# Patient Record
Sex: Female | Born: 2000 | Race: White | Hispanic: No | Marital: Single | Smoking: Never smoker
Health system: Southern US, Community
[De-identification: ages and names within clinical notes are randomized; demographics above are authoritative.]

## PROBLEM LIST (undated history)

## (undated) ENCOUNTER — Ambulatory Visit: Admission: EM | Source: Home / Self Care

---
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
## 2022-11-28 ENCOUNTER — Ambulatory Visit
Admission: EM | Admit: 2022-11-28 | Discharge: 2022-11-28 | Disposition: A | Discharge status: Home / Self Care | Payer: Medicaid Other | Attending: Urgent Care | Admitting: Urgent Care

## 2022-11-28 DIAGNOSIS — N3001 Acute cystitis with hematuria: Secondary | ICD-10-CM | POA: Diagnosis not present

## 2022-11-28 DIAGNOSIS — Z7251 High risk heterosexual behavior: Secondary | ICD-10-CM

## 2022-11-28 DIAGNOSIS — R102 Pelvic and perineal pain: Secondary | ICD-10-CM | POA: Diagnosis not present

## 2022-11-28 LAB — POCT URINALYSIS DIP (MANUAL ENTRY)
Bilirubin, UA: NEGATIVE
Glucose, UA: NEGATIVE mg/dL
Ketones, POC UA: NEGATIVE mg/dL
Leukocytes, UA: NEGATIVE
Nitrite, UA: POSITIVE — AB
Protein Ur, POC: 30 mg/dL — AB
Spec Grav, UA: >=1.03 — AB (ref 1.010–1.025)
Urobilinogen, UA: 0.2 E.U./dL
pH, UA: 6.5 (ref 5.0–8.0)

## 2022-11-28 LAB — POCT URINE PREGNANCY: Preg Test, Ur: NEGATIVE

## 2022-11-28 MED ORDER — NITROFURANTOIN MONOHYD MACRO 100 MG PO CAPS: 100.0000 mg | ORAL_CAPSULE | Freq: Two times a day (BID) | ORAL | 0 refills | Status: DC

## 2022-11-28 NOTE — Discharge Instructions (Signed)
Please start nitrofurantoin to address an urinary tract infection. Make sure you hydrate very well with plain water and a quantity of 64 ounces of water a day.  Please limit drinks that are considered urinary irritants such as soda, sweet tea, coffee, energy drinks, alcohol.  These can worsen your urinary and genital symptoms but also be the source of them.  I will let you know about your urine culture and vaginal swab results through MyChart to see if we need to prescribe or change your antibiotics based off of those results.

## 2022-11-28 NOTE — ED Provider Notes (Addendum)
[facility] - URGENT [facility]ARE C[facility]  Note:  This document was prepared using Systems analyst and may include unintentional dictation errors.  MRN: [id number] DOB: January 26, 2001  Subjective:   Wendy Little is a 21 y.o. female presenting for 3-day history of acute onset dysuria, hematuria, malodorous urine.  She has also had some abnormal spotting.  No vaginal discharge.  She has had unprotected sex with 2 different female partners.  No fever, nausea, vomiting, flank pain, pelvic pain.  No current facility-administered medications for this encounter. No current outpatient medications on file.   No Known Allergies  History reviewed. No pertinent past medical history.   History reviewed. No pertinent surgical history.  History reviewed. No pertinent family history.  Social History   Tobacco Use   Smoking status: Never   Smokeless tobacco: Never  Substance Use Topics   Alcohol use: No    ROS   Objective:   Vitals: BP 110/74 (BP Location: Left Arm)   Pulse 89   Temp 99.5 F (37.5 C) (Oral)   Resp 18   Ht '5\' 5"'$  (1.651 m)   Wt 208 lb (94.3 kg)   LMP 11/14/2022   SpO2 96%   BMI 34.61 kg/m   Physical Exam Constitutional:      General: She is not in acute distress.    Appearance: Normal appearance. She is well-developed. She is not ill-appearing, toxic-appearing or diaphoretic.  HENT:     Head: Normocephalic and atraumatic.     Nose: Nose normal.     Mouth/Throat:     Mouth: Mucous membranes are moist.  Eyes:     General: No scleral icterus.       Right eye: No discharge.        Left eye: No discharge.     Extraocular Movements: Extraocular movements intact.     Conjunctiva/sclera: Conjunctivae normal.  Cardiovascular:     Rate and Rhythm: Normal rate.  Pulmonary:     Effort: Pulmonary effort is normal.  Abdominal:     General: Bowel sounds are normal. There is no distension.     Palpations: Abdomen is soft. There is no mass.      Tenderness: There is abdominal tenderness (right suprapubic region). There is no right CVA tenderness, left CVA tenderness, guarding or rebound.  Skin:    General: Skin is warm and dry.  Neurological:     General: No focal deficit present.     Mental Status: She is alert and oriented to person, place, and time.  Psychiatric:        Mood and Affect: Mood normal.        Behavior: Behavior normal.        Thought Content: Thought content normal.        Judgment: Judgment normal.     Results for orders placed or performed during the hospital encounter of 11/28/22 (from the past 24 hour(s))  POCT urine pregnancy     Status: None   Collection Time: 11/28/22  2:28 PM  Result Value Ref Range   Preg Test, Ur Negative Negative  POCT urinalysis dipstick     Status: Abnormal   Collection Time: 11/28/22  2:28 PM  Result Value Ref Range   Color, UA yellow yellow   Clarity, UA cloudy (A) clear   Glucose, UA negative negative mg/dL   Bilirubin, UA negative negative   Ketones, POC UA negative negative mg/dL   Spec Grav, UA >=1.030 (A) 1.010 - 1.025   Blood,  UA trace-intact (A) negative   pH, UA 6.5 5.0 - 8.0   Protein Ur, POC =30 (A) negative mg/dL   Urobilinogen, UA 0.2 0.2 or 1.0 E.U./dL   Nitrite, UA Positive (A) Negative   Leukocytes, UA Negative Negative    Assessment and Plan :   PDMP not reviewed this encounter.  1. Acute cystitis with hematuria   2. Unprotected sex   3. Acute pelvic pain, female     Start Macrobid to cover for acute cystitis, urine culture pending.  If patient test positive for bacterial vaginosis I do recommend treatment as she does have pelvic pain on exam and despite no vaginal discharge.  Recommended aggressive hydration, limiting urinary irritants. Counseled patient on potential for adverse effects with medications prescribed/recommended today, ER and return-to-clinic precautions discussed, patient verbalized understanding.      Jaynee Eagles, PA-C 11/28/22  1526

## 2022-11-28 NOTE — ED Triage Notes (Signed)
Pt states that she has had some abnormal bleeding or spotting.  Pt states that she has a foul smell or urine.

## 2022-11-29 LAB — CERVICOVAGINAL ANCILLARY ONLY
Bacterial Vaginitis (gardnerella): POSITIVE — AB
Chlamydia: NEGATIVE
Comment: NEGATIVE
Comment: NEGATIVE
Comment: NEGATIVE
Comment: NORMAL
Neisseria Gonorrhea: NEGATIVE
Trichomonas: NEGATIVE

## 2022-11-30 ENCOUNTER — Telehealth (HOSPITAL_COMMUNITY): Payer: Self-pay | Admitting: Emergency Medicine

## 2022-11-30 LAB — URINE CULTURE: Culture: >=100000 — AB

## 2022-11-30 MED ORDER — METRONIDAZOLE 500 MG PO TABS: 500.0000 mg | ORAL_TABLET | Freq: Two times a day (BID) | ORAL | 0 refills | Status: DC

## 2022-12-03 ENCOUNTER — Encounter (HOSPITAL_COMMUNITY): Payer: Self-pay

## 2022-12-03 ENCOUNTER — Other Ambulatory Visit: Payer: Self-pay

## 2022-12-03 ENCOUNTER — Emergency Department (HOSPITAL_COMMUNITY)
Admission: EM | Admit: 2022-12-03 | Discharge: 2022-12-03 | Disposition: A | Discharge status: Home / Self Care | Payer: Medicaid Other | Attending: Emergency Medicine | Admitting: Emergency Medicine

## 2022-12-03 ENCOUNTER — Emergency Department (HOSPITAL_COMMUNITY): Payer: Medicaid Other

## 2022-12-03 DIAGNOSIS — Z20822 Contact with and (suspected) exposure to covid-19: Secondary | ICD-10-CM | POA: Insufficient documentation

## 2022-12-03 DIAGNOSIS — J029 Acute pharyngitis, unspecified: Secondary | ICD-10-CM

## 2022-12-03 DIAGNOSIS — M25511 Pain in right shoulder: Secondary | ICD-10-CM | POA: Diagnosis not present

## 2022-12-03 DIAGNOSIS — K0501 Acute gingivitis, non-plaque induced: Secondary | ICD-10-CM | POA: Diagnosis not present

## 2022-12-03 DIAGNOSIS — B9789 Other viral agents as the cause of diseases classified elsewhere: Secondary | ICD-10-CM | POA: Diagnosis not present

## 2022-12-03 DIAGNOSIS — J028 Acute pharyngitis due to other specified organisms: Secondary | ICD-10-CM | POA: Insufficient documentation

## 2022-12-03 DIAGNOSIS — K0510 Chronic gingivitis, plaque induced: Secondary | ICD-10-CM

## 2022-12-03 LAB — RESP PANEL BY RT-PCR (RSV, FLU A&B, COVID)  RVPGX2
Influenza A by PCR: NEGATIVE
Influenza B by PCR: NEGATIVE
Resp Syncytial Virus by PCR: NEGATIVE
SARS Coronavirus 2 by RT PCR: NEGATIVE

## 2022-12-03 LAB — GROUP A STREP BY PCR: Group A Strep by PCR: NOT DETECTED

## 2022-12-03 MED ORDER — LIDOCAINE 5 % EX PTCH: 1.0000 | MEDICATED_PATCH | CUTANEOUS | 0 refills | Status: DC

## 2022-12-03 MED ORDER — LIDOCAINE 5 % EX PTCH
1.0000 | MEDICATED_PATCH | Freq: Once | CUTANEOUS | Status: DC
  Administered 2022-12-03: 1 via TRANSDERMAL
  Filled 2022-12-03: qty 1

## 2022-12-03 MED ORDER — ACETAMINOPHEN 325 MG PO TABS
650.0000 mg | ORAL_TABLET | Freq: Once | ORAL | Status: AC
  Administered 2022-12-03: 650 mg via ORAL
  Filled 2022-12-03: qty 2

## 2022-12-03 NOTE — ED Triage Notes (Signed)
Pt c/o sore throat, swollen gums, lips, excessive sweating, joint pain, fevers x 3 days; NAD in triage; denies sick contacts

## 2022-12-03 NOTE — ED Provider Triage Note (Signed)
Emergency Medicine Provider Triage Evaluation Note  Wendy Little , a 21 y.o. female  was evaluated in triage.  Pt complains of sore throat and fever x 3 days.  Tmax 101 F yesterday.  No difficulty swallowing or shortness of breath.  Review of Systems  Positive: Sore throat Negative: SOB  Physical Exam  BP 125/79 (BP Location: Right Arm)   Pulse 96   Temp 98.8 F (37.1 C)   Resp 15   Ht '5\' 5"'$  (1.651 m)   Wt 94 kg   LMP 11/14/2022   SpO2 100%   BMI 34.49 kg/m  Gen:   Awake, no distress   Resp:  Normal effort  MSK:   Moves extremities without difficulty  Other:    Medical Decision Making  Medically screening exam initiated at 12:25 PM.  Appropriate orders placed.  Michele Judy was informed that the remainder of the evaluation will be completed by another provider, this initial triage assessment does not replace that evaluation, and the importance of remaining in the ED until their evaluation is complete.  Strep COVID/flu   Suzy Bouchard, PA-C 12/03/22 1225

## 2022-12-03 NOTE — Discharge Instructions (Addendum)
It was a pleasure taking care of you today!  Your swabs were negative for COVID, Flu, RSV, and Strep. Attached is a Warden/ranger for local dentists in the area.  Also attached is information for the on-call dentist, you may call and set up a follow-up appointment regarding today's ED visit.  Ensure that you maintain fluid intake.  You will be sent a prescription for lidoderm patches, use as directed. You may take over the counter 500 mg tylenol every 6 hours and alternate with 600 mg ibuprofen every 6 hours as needed for your symptoms for no more than 7 days. You may follow-up with your primary care provider as needed.  Return to the ED if you experience increasing/worsening symptoms.

## 2022-12-03 NOTE — ED Provider Notes (Signed)
[facility] EMERGENCY DEPARTMENT Provider Note   CSN: [id number] Arrival date & time: 12/03/22  1204     History  Chief Complaint  Patient presents with   Sore Throat   Oral Swelling    Wendy Little is a 21 y.o. female who presents emergency department with concerns for sore throat onset 3 days.  Has associated fever (max temp 101 F yesterday).  Also notes right shoulder pain, patient works at [employer] and notes that she may have lifted an item improperly.  Also voices concern for swelling of the gums and bleeding of her gums when she brushes her teeth.  Patient does have a dentist at this time however has not followed up with them.  No meds tried at home.  No sick contacts.  Denies trouble breathing, trouble swallowing, gum drainage, rhinorrhea, nasal congestion, cough.  The history is provided by the patient and a parent. No language interpreter was used.       Home Medications Prior to Admission medications   Medication Sig Start Date End Date Taking? Authorizing Provider  lidocaine (LIDODERM) 5 % Place 1 patch onto the skin daily. Remove & Discard patch within 12 hours or as directed by MD 12/03/22  Yes Ostin Mathey A, PA-C  metroNIDAZOLE (FLAGYL) 500 MG tablet Take 1 tablet (500 mg total) by mouth 2 (two) times daily. 11/30/22   Lamptey, Myrene Galas, MD  nitrofurantoin, macrocrystal-monohydrate, (MACROBID) 100 MG capsule Take 1 capsule (100 mg total) by mouth 2 (two) times daily. 11/28/22   Jaynee Eagles, PA-C      Allergies    Patient has no known allergies.    Review of Systems   Review of Systems  All other systems reviewed and are negative.   Physical Exam Updated Vital Signs BP 125/79 (BP Location: Right Arm)   Pulse 96   Temp 98.8 F (37.1 C)   Resp 15   Ht '5\' 5"'$  (1.651 m)   Wt 94 kg   LMP 11/14/2022   SpO2 100%   BMI 34.49 kg/m  Physical Exam Vitals and nursing note reviewed.  Constitutional:      General: She is not in acute distress.     Appearance: Normal appearance.  HENT:     Mouth/Throat:     Pharynx: Oropharynx is clear. Uvula midline. No posterior oropharyngeal erythema or uvula swelling.     Tonsils: No tonsillar exudate or tonsillar abscesses.     Comments: Uvula midline without swelling. No posterior pharyngeal erythema or tonsillar exudate noted. Patent airway. Pt able to speak in clear complete sentences. Tolerating oral secretions.  Swelling of the gums noted. Eyes:     General: No scleral icterus.    Extraocular Movements: Extraocular movements intact.  Cardiovascular:     Rate and Rhythm: Normal rate.  Pulmonary:     Effort: Pulmonary effort is normal. No respiratory distress.  Abdominal:     Palpations: Abdomen is soft. There is no mass.     Tenderness: There is no abdominal tenderness.  Musculoskeletal:        General: Normal range of motion.     Cervical back: Neck supple.     Comments: Mild tenderness to palpation noted to posterior aspect of right shoulder.  Full active range of motion of right shoulder.  Skin:    General: Skin is warm and dry.     Findings: No rash.  Neurological:     Mental Status: She is alert.     Sensory:  Sensation is intact.     Motor: Motor function is intact.  Psychiatric:        Behavior: Behavior normal.     ED Results / Procedures / Treatments   Labs (all labs ordered are listed, but only abnormal results are displayed) Labs Reviewed  GROUP A STREP BY PCR  RESP PANEL BY RT-PCR (RSV, FLU A&B, COVID)  [id number]    EKG None  Radiology DG Shoulder Right  Result Date: 12/03/2022 CLINICAL DATA:  Nontraumatic shoulder pain EXAM: RIGHT SHOULDER - 2+ VIEW COMPARISON:  04/24/2017 FINDINGS: There is no evidence of fracture or dislocation. There is no evidence of arthropathy or other focal bone abnormality. Soft tissues are unremarkable. IMPRESSION: Normal radiographs. Electronically Signed   By: Nelson Chimes M.D.   On: 12/03/2022 14:22    Procedures Procedures     Medications Ordered in ED Medications  lidocaine (LIDODERM) 5 % 1 patch (1 patch Transdermal Patch Applied 12/03/22 1337)  acetaminophen (TYLENOL) tablet 650 mg (650 mg Oral Given 12/03/22 1337)    ED Course/ Medical Decision Making/ A&P Clinical Course as of 12/03/22 1533  Sat Dec 03, 2022  1436 Re-evaluated and pt noted mild improvement of symptoms with treatment regimen. Discussed discharge treatment plan. Pt agreeable at this time. Pt appears safe for discharge. [SB]    Clinical Course User Index [SB] Sarann Tregre A, PA-C                           Medical Decision Making Amount and/or Complexity of Data Reviewed Radiology: ordered.  Risk OTC drugs. Prescription drug management.   Pt presents with concerns for sore throat onset 3 days.  Denies sick contacts.  Patient afebrile.  On exam patient with uvula midline without swelling, no posterior pharyngeal erythema or tonsillar exudate.  Patient tolerating oral secretions and able to speak in clear complete sentences.  Patient with mild tenderness to palpation noted to posterior aspect of right shoulder with full active range of motion of right shoulder.  Differential diagnosis includes COVID, flu, RSV, strep, viral pharyngitis, fracture, dislocation.    Additional history obtained:  Additional history obtained from Parent  Labs:  I ordered, and personally interpreted labs.  The pertinent results include:   Negative COVID, Flu, RSV, strep swabs  Imaging: I ordered imaging studies including right shoulder xray I independently visualized and interpreted imaging which showed: no acute findings I agree with the radiologist interpretation  Medications:  I ordered medication including Tylenol, lidoderm, ice for symptom management Reevaluation of the patient after these medicines and interventions, I reevaluated the patient and found that they have improved I have reviewed the patients home medicines and have made adjustments  as needed  Disposition: Presentation suspicious for viral pharyngitis, gingivitis, right shoulder pain.  Doubt COVID, flu, RSV, strep.  Doubt fracture or dislocation at this time. After consideration of the diagnostic results and the patients response to treatment, I feel that the patient would benefit from Discharge home. Offered work note, pt declines at this time.  Prescription for Lidoderm patch sent to patient's pharmacy.  Pt provided with dental resource guide as well as information for on-call dentist. Supportive care measures and strict return precautions discussed with patient at bedside. Pt acknowledges and verbalizes understanding. Pt appears safe for discharge. Follow up as indicated in discharge paperwork.    This chart was dictated using voice recognition software, Dragon. Despite the best efforts of this provider to proofread and  correct errors, errors may still occur which can change documentation meaning.  Final Clinical Impression(s) / ED Diagnoses Final diagnoses:  Viral pharyngitis  Gingivitis  Acute pain of right shoulder    Rx / DC Orders ED Discharge Orders          Ordered    lidocaine (LIDODERM) 5 %  Every 24 hours        12/03/22 1435              Naika Noto A, PA-C 12/03/22 1533    Godfrey Pick, MD 12/03/22 2141

## 2023-03-27 ENCOUNTER — Ambulatory Visit
Admission: EM | Admit: 2023-03-27 | Discharge: 2023-03-27 | Disposition: A | Discharge status: Home / Self Care | Payer: Medicaid Other

## 2023-03-27 DIAGNOSIS — J069 Acute upper respiratory infection, unspecified: Secondary | ICD-10-CM

## 2023-03-27 LAB — POCT RAPID STREP A (OFFICE): Rapid Strep A Screen: NEGATIVE

## 2023-03-27 MED ORDER — LEVOCETIRIZINE DIHYDROCHLORIDE 5 MG PO TABS: 5.0000 mg | ORAL_TABLET | Freq: Every evening | ORAL | 0 refills | Status: DC

## 2023-03-27 MED ORDER — IPRATROPIUM BROMIDE 0.03 % NA SOLN: 2.0000 | Freq: Two times a day (BID) | NASAL | 0 refills | Status: DC

## 2023-03-27 NOTE — ED Triage Notes (Signed)
Pt c/o sneezing, cough, runny nose and sore throat for a few days.  Home interventions: nyquil

## 2023-03-27 NOTE — Discharge Instructions (Addendum)
Your symptoms sound most consistent with a viral illness. Your strep test is negative. Supportive care is the mainstay of treatment - rest, hydration, frequent hand washing, tylenol or ibuprofen as needed for fever or aches.  I have prescribed you a nasal spray and antihistamine to help with the symptoms. Salt water gargles, chloraseptic spray and lozenges may help with your throat.

## 2023-03-27 NOTE — ED Provider Notes (Signed)
[facility]    CSN: [id number] Arrival date & time: 03/27/23  1336      History   Chief Complaint Chief Complaint  Patient presents with   Sore Throat   Nasal Congestion    HPI Anh Winfrey is a 22 y.o. female.   22 year old female presents today due to concerns of a sore throat, nasal congestion, cough, swollen lymph nodes, and possible fever.  States she works at [employer] and one of her coworkers have been sick recently.  She is concerned about possible strep throat.  Symptoms started 2 days ago, sore throat started her symptoms, followed by nasal congestion yesterday.  Reports symptoms are worse in the morning with thick posterior drainage.  She tried NyQuil 1 time last evening, no other treatments tried.  Denies a known fever, but states she has been feeling warm.   Sore Throat    History reviewed. No pertinent past medical history.  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 No pertinent surgical history.  OB History   No obstetric history on file.      Home Medications    Prior to Admission medications   Medication Sig Start Date End Date Taking? Authorizing Provider  estradiol (ESTRACE) 2 MG tablet Take 1 tablet by mouth daily. 03/14/23 04/13/23 Yes [provider]  etonogestrel (NEXPLANON) 68 MG IMPL implant 68 mg by subdermal route once.    [provider]  ipratropium (ATROVENT) 0.03 % nasal spray Place 2 sprays into both nostrils every 12 (twelve) hours. 03/27/23  Yes Rhilee Currin L, PA  levocetirizine (XYZAL) 5 MG tablet Take 1 tablet (5 mg total) by mouth every evening. 03/27/23  Yes Tyeson Tanimoto, Sherren Kerns, PA    Family History History reviewed. No pertinent family history.  Social History Social History    Tobacco Use   Smoking status: Never   Smokeless tobacco: Never  Vaping Use   Vaping Use: Never used  Substance Use Topics   Alcohol use: No   Drug use: Never     Allergies   Patient has no known allergies.   Review of Systems Review of Systems As per HPI  Physical Exam Triage Vital Signs ED Triage Vitals  Enc Vitals Group     BP 03/27/23 1354 123/79     Pulse Rate 03/27/23 1354 (!) 105     Resp 03/27/23 1354 18     Temp 03/27/23 1354 98.4 F (36.9 C)     Temp Source 03/27/23 1354 Oral     SpO2 03/27/23 1354 98 %     Weight --      Height --      Head Circumference --      Peak Flow --      Pain Score 03/27/23 1353 3     Pain Loc --      Pain Edu? --      Excl. in [place]? --    No data found.  Updated Vital Signs BP 123/79 (BP Location: Right Arm)   Pulse (!) 105   Temp 98.4 F (36.9 C) (Oral)   Resp 18   SpO2 98%   Visual Acuity Right Eye Distance:   Left Eye Distance:   Bilateral Distance:    Right Eye Near:   Left Eye Near:  Bilateral Near:     Physical Exam Vitals and nursing note reviewed.  Constitutional:      Appearance: She is well-developed and normal weight. She is not ill-appearing, toxic-appearing or diaphoretic.  HENT:     Head: Normocephalic and atraumatic.     Right Ear: Tympanic membrane and ear canal normal. No drainage, swelling or tenderness. No middle ear effusion. Tympanic membrane is not erythematous.     Left Ear: Tympanic membrane and ear canal normal. No drainage, swelling or tenderness.  No middle ear effusion. Tympanic membrane is not erythematous.     Nose: Congestion and rhinorrhea present.     Mouth/Throat:     Mouth: Mucous membranes are moist. No oral lesions.     Pharynx: Oropharynx is clear. Posterior oropharyngeal erythema (minimal posterior pharynx) present. No pharyngeal swelling, oropharyngeal exudate or uvula swelling.     Tonsils: No tonsillar exudate or tonsillar abscesses.  Eyes:     Extraocular  Movements:     Right eye: Normal extraocular motion.     Left eye: Normal extraocular motion.     Conjunctiva/sclera: Conjunctivae normal.     Pupils: Pupils are equal, round, and reactive to light.  Neck:     Thyroid: No thyromegaly.  Cardiovascular:     Rate and Rhythm: Normal rate.     Heart sounds: Normal heart sounds. No murmur heard. Pulmonary:     Effort: Pulmonary effort is normal. No respiratory distress.     Breath sounds: Normal breath sounds. No stridor. No wheezing, rhonchi or rales.  Chest:     Chest wall: No tenderness.  Abdominal:     General: Bowel sounds are normal. There is no distension.     Palpations: Abdomen is soft. There is no mass.     Tenderness: There is no abdominal tenderness. There is no guarding or rebound.     Hernia: No hernia is present.  Musculoskeletal:     Cervical back: Normal range of motion and neck supple.  Lymphadenopathy:     Cervical: No cervical adenopathy.  Skin:    General: Skin is warm.     Capillary Refill: Capillary refill takes less than 2 seconds.     Coloration: Skin is not pale.     Findings: No erythema or rash.  Neurological:     General: No focal deficit present.     Mental Status: She is alert and oriented to person, place, and time.  Psychiatric:        Mood and Affect: Mood normal.        Behavior: Behavior normal.      UC Treatments / Results  Labs (all labs ordered are listed, but only abnormal results are displayed) Labs Reviewed  POCT RAPID STREP A (OFFICE)    EKG   Radiology No results found.  Procedures Procedures (including critical care time)  Medications Ordered in UC Medications - No data to display  Initial Impression / Assessment and Plan / UC Course  I have reviewed the triage vital signs and the nursing notes.  Pertinent labs & imaging results that were available during my care of the patient were reviewed by me and considered in my medical decision making (see chart for  details).     Viral URI - sx consistent with viral illness. VSS, pt has tried nyquil x 1 dose. Suspect rhinovirus vs allergies. Will start atrovent nasal with xyzal for symptomatic relief.    Final Clinical Impressions(s) / UC Diagnoses   Final diagnoses:  Viral upper respiratory tract infection with cough     Discharge Instructions      Your symptoms sound most consistent with a viral illness. Your strep test is negative. Supportive care is the mainstay of treatment - rest, hydration, frequent hand washing, tylenol or ibuprofen as needed for fever or aches.  I have prescribed you a nasal spray and antihistamine to help with the symptoms. Salt water gargles, chloraseptic spray and lozenges may help with your throat.      ED Prescriptions     Medication Sig Dispense Auth. Provider   ipratropium (ATROVENT) 0.03 % nasal spray Place 2 sprays into both nostrils every 12 (twelve) hours. 30 mL Sherril Shipman L, PA   levocetirizine (XYZAL) 5 MG tablet Take 1 tablet (5 mg total) by mouth every evening. 30 tablet Illyanna Petillo L, [place]      PDMP not reviewed this encounter.   Chaney Malling, [place] 03/27/23 1431

## 2023-04-20 ENCOUNTER — Ambulatory Visit
Admission: EM | Admit: 2023-04-20 | Discharge: 2023-04-20 | Disposition: A | Discharge status: Home / Self Care | Payer: Medicaid Other | Attending: Nurse Practitioner | Admitting: Nurse Practitioner

## 2023-04-20 DIAGNOSIS — L309 Dermatitis, unspecified: Secondary | ICD-10-CM | POA: Diagnosis not present

## 2023-04-20 MED ORDER — CLOTRIMAZOLE-BETAMETHASONE 1-0.05 % EX CREA
TOPICAL_CREAM | CUTANEOUS | 0 refills | Status: DC
Start: 2023-04-20 — End: 2024-03-28

## 2023-04-20 NOTE — ED Triage Notes (Signed)
Pt has multiple raised, red sores to toes across bilat feet. Pt reports have been there for approx 1 month, but the pain has increased. States may be chemical burn from products used at work.

## 2023-04-20 NOTE — ED Provider Notes (Signed)
[facility]    CSN: [id number] Arrival date & time: 04/20/23  1619      History   Chief Complaint No chief complaint on file.   HPI Wendy Little is a 22 y.o. female presents for evaluation of red bumps on her feet.  Patient reports 1 months she has scattered red bumps on her feet that are mildly pruritic.  Denies any swelling, drainage, warmth, fevers, chills.  Denies history of eczema or psoriasis.  She does states she works at a [employer] and when they clean the floors at night they use a mixture of hot water and a cleaning agent and is not sure if this is related to that.  She has not tried any OTC medications for symptoms.  No other concerns at this time.  HPI  History reviewed. No pertinent past medical history.  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 No pertinent surgical history.  OB History   No obstetric history on file.      Home Medications    Prior to Admission medications   Medication Sig Start Date End Date Taking? Authorizing Provider  clotrimazole-betamethasone (LOTRISONE) cream Apply to affected area 2 times daily prn 04/20/23  Yes Radford Pax, NP  estradiol (ESTRACE) 2 MG tablet Take 1 tablet by mouth daily. 03/14/23 04/13/23  [provider]  etonogestrel (NEXPLANON) 68 MG IMPL implant 68 mg by subdermal route once.    [provider]  ipratropium (ATROVENT) 0.03 % nasal spray Place 2 sprays into both nostrils every 12 (twelve) hours. 03/27/23   Crain, Whitney L, PA  levocetirizine (XYZAL) 5 MG tablet Take 1 tablet (5 mg total) by mouth every evening. 03/27/23   Maretta Bees, PA    Family History History reviewed. No pertinent family history.  Social History Social History   Tobacco Use    Smoking status: Never   Smokeless tobacco: Never  Vaping Use   Vaping Use: Every day  Substance Use Topics   Alcohol use: No   Drug use: Never     Allergies   Patient has no known allergies.   Review of Systems Review of Systems  Skin:  Positive for rash.     Physical Exam Triage Vital Signs ED Triage Vitals  Enc Vitals Group     BP 04/20/23 1709 120/76     Pulse Rate 04/20/23 1709 76     Resp 04/20/23 1709 18     Temp 04/20/23 1709 98.2 F (36.8 C)     Temp Source 04/20/23 1709 Oral     SpO2 04/20/23 1709 96 %     Weight --      Height --      Head Circumference --      Peak Flow --      Pain Score 04/20/23 1707 8     Pain Loc --      Pain Edu? --      Excl. in [place]? --    No data found.  Updated Vital Signs BP 120/76 (BP Location: Right Arm)   Pulse 76   Temp 98.2 F (36.8 C) (Oral)   Resp 18   SpO2 96%   Visual Acuity Right Eye Distance:   Left Eye Distance:   Bilateral Distance:  Right Eye Near:   Left Eye Near:    Bilateral Near:     Physical Exam Vitals and nursing note reviewed.  Constitutional:      General: She is not in acute distress.    Appearance: Normal appearance. She is not ill-appearing.  HENT:     Head: Normocephalic and atraumatic.  Eyes:     Pupils: Pupils are equal, round, and reactive to light.  Cardiovascular:     Rate and Rhythm: Normal rate.  Pulmonary:     Effort: Pulmonary effort is normal.  Musculoskeletal:       Feet:  Feet:     Comments: 3-4 mildly erythematic raised papules to bilateral great toes and 1 to right third toe.  No vesicles, drainage, lesions, scaling.  Nontender to palpation.  No swelling.  No areas on soles of feet or ankles. Skin:    General: Skin is warm and dry.  Neurological:     General: No focal deficit present.     Mental Status: She is alert and oriented to person, place, and time.  Psychiatric:        Mood and Affect: Mood normal.        Behavior: Behavior normal.      UC  Treatments / Results  Labs (all labs ordered are listed, but only abnormal results are displayed) Labs Reviewed - No data to display  EKG   Radiology No results found.  Procedures Procedures (including critical care time)  Medications Ordered in UC Medications - No data to display  Initial Impression / Assessment and Plan / UC Course  I have reviewed the triage vital signs and the nursing notes.  Pertinent labs & imaging results that were available during my care of the patient were reviewed by me and considered in my medical decision making (see chart for details).    I reviewed labs from March 2024 as well as recent provider notes. Reviewed symptoms with patient.  Looks more dermatitis in nature than chemical reaction. Trial of betamethasone clotrimazole to areas.  Advised to keep feet clean and dry Follow-up with PCP if symptoms do not improve ER precautions reviewed and patient verbalized understanding Final Clinical Impressions(s) / UC Diagnoses   Final diagnoses:  Dermatitis     Discharge Instructions      Start clotrimazole-betamethasone topical cream to the areas on your feet.  Please keep your feet clean and dry Please follow-up with your PCP if your symptoms or not improving Please go to the emergency room if you develop any worsening symptoms   ED Prescriptions     Medication Sig Dispense Auth. Provider   clotrimazole-betamethasone (LOTRISONE) cream Apply to affected area 2 times daily prn 45 g Radford Pax, NP      PDMP not reviewed this encounter.   Radford Pax, NP 04/20/23 [phone]

## 2023-04-20 NOTE — Discharge Instructions (Signed)
Start clotrimazole-betamethasone topical cream to the areas on your feet.  Please keep your feet clean and dry Please follow-up with your PCP if your symptoms or not improving Please go to the emergency room if you develop any worsening symptoms

## 2024-03-28 ENCOUNTER — Ambulatory Visit
Admission: EM | Admit: 2024-03-28 | Discharge: 2024-03-28 | Disposition: A | Discharge status: Home / Self Care | Attending: Family Medicine | Admitting: Family Medicine

## 2024-03-28 DIAGNOSIS — J988 Other specified respiratory disorders: Secondary | ICD-10-CM

## 2024-03-28 DIAGNOSIS — J309 Allergic rhinitis, unspecified: Secondary | ICD-10-CM

## 2024-03-28 DIAGNOSIS — B9789 Other viral agents as the cause of diseases classified elsewhere: Secondary | ICD-10-CM

## 2024-03-28 MED ORDER — CETIRIZINE HCL 10 MG PO TABS: 10.0000 mg | ORAL_TABLET | Freq: Every day | ORAL | 0 refills | Status: DC

## 2024-03-28 MED ORDER — PROMETHAZINE-DM 6.25-15 MG/5ML PO SYRP: 5.0000 mL | ORAL_SOLUTION | Freq: Three times a day (TID) | ORAL | 0 refills | Status: AC | PRN

## 2024-03-28 MED ORDER — PSEUDOEPHEDRINE HCL 60 MG PO TABS: 60.0000 mg | ORAL_TABLET | Freq: Three times a day (TID) | ORAL | 0 refills | Status: DC | PRN

## 2024-03-28 NOTE — ED Triage Notes (Signed)
 Patient presents to the office for sinus congestion,sore throat and cough x 1 day.  Home Intervention: Nyquil last night.

## 2024-03-28 NOTE — ED Provider Notes (Signed)
 [facility]  Note:  This document was prepared using Conservation officer, historic buildings and may include unintentional dictation errors.  MRN: [id number] DOB: Jun 04, 2001  Subjective:   Wendy Little is a 23 y.o. female presenting for 1 day history of sinus congestion, drainage, throat pain, coughing.  No fever, ear pain, chest pain, shortness of breath or wheezing.  No asthma.  No smoking of any kind including cigarettes, cigars, vaping, marijuana use.  Declines respiratory testing.  Has allergic rhinitis but does not take anything consistently for this.  No current facility-administered medications for this encounter.  Current Outpatient Medications:    etonogestrel (NEXPLANON) 68 MG IMPL implant, 68 mg by subdermal route once., Disp: , Rfl:    clotrimazole-betamethasone (LOTRISONE) cream, Apply to affected area 2 times daily prn, Disp: 45 g, Rfl: 0   estradiol (ESTRACE) 2 MG tablet, Take 1 tablet by mouth daily., Disp: , Rfl:    ipratropium (ATROVENT) 0.03 % nasal spray, Place 2 sprays into both nostrils every 12 (twelve) hours., Disp: 30 mL, Rfl: 0   levocetirizine (XYZAL) 5 MG tablet, Take 1 tablet (5 mg total) by mouth every evening., Disp: 30 tablet, Rfl: 0   No Known Allergies  History reviewed. No pertinent past medical history.   History reviewed. No pertinent surgical history.  History reviewed. No pertinent family history.  Social History   Tobacco Use   Smoking status: Never   Smokeless tobacco: Never  Vaping Use   Vaping status: Every Day  Substance Use Topics   Alcohol use: No   Drug use: Never    ROS   Objective:   Vitals: BP 107/76 (BP Location: Left Arm)   Pulse (!) 102   Temp 99.1 F (37.3 C) (Oral)   Resp 18   LMP 03/19/2024 (Approximate)   SpO2 96%   Physical Exam Constitutional:      General: She is not in acute distress.    Appearance: Normal appearance. She is well-developed and normal weight. She is not  ill-appearing, toxic-appearing or diaphoretic.  HENT:     Head: Normocephalic and atraumatic.     Right Ear: Tympanic membrane, ear canal and external ear normal. No drainage or tenderness. No middle ear effusion. There is no impacted cerumen. Tympanic membrane is not erythematous or bulging.     Left Ear: Tympanic membrane, ear canal and external ear normal. No drainage or tenderness.  No middle ear effusion. There is no impacted cerumen. Tympanic membrane is not erythematous or bulging.     Nose: Congestion and rhinorrhea present.     Mouth/Throat:     Mouth: Mucous membranes are moist. No oral lesions.     Pharynx: No pharyngeal swelling, oropharyngeal exudate, posterior oropharyngeal erythema or uvula swelling.     Tonsils: No tonsillar exudate or tonsillar abscesses.  Eyes:     General: No scleral icterus.       Right eye: No discharge.        Left eye: No discharge.     Extraocular Movements: Extraocular movements intact.     Right eye: Normal extraocular motion.     Left eye: Normal extraocular motion.     Conjunctiva/sclera: Conjunctivae normal.  Cardiovascular:     Rate and Rhythm: Normal rate and regular rhythm.     Heart sounds: Normal heart sounds. No murmur heard.    No friction rub. No gallop.  Pulmonary:     Effort: Pulmonary effort is normal. No respiratory distress.  Breath sounds: No stridor. No wheezing, rhonchi or rales.  Chest:     Chest wall: No tenderness.  Musculoskeletal:     Cervical back: Normal range of motion and neck supple.  Lymphadenopathy:     Cervical: No cervical adenopathy.  Skin:    General: Skin is warm and dry.  Neurological:     General: No focal deficit present.     Mental Status: She is alert and oriented to person, place, and time.  Psychiatric:        Mood and Affect: Mood normal.        Behavior: Behavior normal.     Assessment and Plan :   PDMP not reviewed this encounter.  1. Viral respiratory infection   2. Allergic  rhinitis, unspecified seasonality, unspecified trigger    Deferred imaging given clear cardiopulmonary exam, hemodynamically stable vital signs. Suspect viral URI, viral syndrome. Physical exam findings reassuring and vital signs stable for discharge. Advised supportive care, offered symptomatic relief. Counseled patient on potential for adverse effects with medications prescribed/recommended today, ER and return-to-clinic precautions discussed, patient verbalized understanding.     Wallis Bamberg, PA-C 03/28/24 1316

## 2024-03-28 NOTE — Discharge Instructions (Signed)
 We will manage this as a viral respiratory illness. For sore throat or cough try using a honey-based tea. Use 3 teaspoons of honey with juice squeezed from half lemon. Place shaved pieces of ginger into 1/2-1 cup of water and warm over stove top. Then mix the ingredients and repeat every 4 hours as needed. Please take ibuprofen 600mg  every 6 hours with food alternating with OR taken together with Tylenol 500mg -650mg  every 6 hours for throat pain, fevers, aches and pains. Hydrate very well with at least 2 liters of water. Eat light meals such as soups (chicken and noodles, vegetable, chicken and wild rice).  Do not eat foods that you are allergic to.  Taking an antihistamine like Zyrtec (10mg  daily) can help against postnasal drainage, sinus congestion which can cause sinus pain, sinus headaches, throat pain, painful swallowing, coughing.  You can take this together with pseudoephedrine (Sudafed) at a dose of 60 mg 3 times a day or twice daily as needed for the same kind of nasal drip, congestion.  Use cough syrup as needed.

## 2024-05-17 ENCOUNTER — Encounter (HOSPITAL_COMMUNITY): Payer: Self-pay

## 2024-05-17 ENCOUNTER — Emergency Department (HOSPITAL_COMMUNITY): Admission: EM | Admit: 2024-05-17 | Discharge: 2024-05-17 | Disposition: A | Discharge status: Home / Self Care

## 2024-05-17 ENCOUNTER — Emergency Department (HOSPITAL_COMMUNITY)

## 2024-05-17 ENCOUNTER — Ambulatory Visit
Admission: EM | Admit: 2024-05-17 | Discharge: 2024-05-17 | Disposition: A | Discharge status: Home / Self Care | Attending: Family Medicine | Admitting: Family Medicine

## 2024-05-17 ENCOUNTER — Other Ambulatory Visit: Payer: Self-pay

## 2024-05-17 DIAGNOSIS — Z113 Encounter for screening for infections with a predominantly sexual mode of transmission: Secondary | ICD-10-CM | POA: Insufficient documentation

## 2024-05-17 DIAGNOSIS — R1010 Upper abdominal pain, unspecified: Secondary | ICD-10-CM | POA: Insufficient documentation

## 2024-05-17 DIAGNOSIS — R197 Diarrhea, unspecified: Secondary | ICD-10-CM | POA: Diagnosis not present

## 2024-05-17 DIAGNOSIS — R1084 Generalized abdominal pain: Secondary | ICD-10-CM

## 2024-05-17 DIAGNOSIS — R110 Nausea: Secondary | ICD-10-CM | POA: Insufficient documentation

## 2024-05-17 DIAGNOSIS — R1031 Right lower quadrant pain: Secondary | ICD-10-CM | POA: Diagnosis not present

## 2024-05-17 DIAGNOSIS — R1011 Right upper quadrant pain: Secondary | ICD-10-CM | POA: Insufficient documentation

## 2024-05-17 DIAGNOSIS — R1013 Epigastric pain: Secondary | ICD-10-CM | POA: Diagnosis not present

## 2024-05-17 LAB — CBC WITH DIFFERENTIAL/PLATELET
Abs Immature Granulocytes: 0.02 10*3/uL (ref 0.00–0.07)
Basophils Absolute: 0.1 10*3/uL (ref 0.0–0.1)
Basophils Relative: 1 %
Eosinophils Absolute: 0.1 10*3/uL (ref 0.0–0.5)
Eosinophils Relative: 1 %
HCT: 41.4 % (ref 36.0–46.0)
Hemoglobin: 13.8 g/dL (ref 12.0–15.0)
Immature Granulocytes: 0 %
Lymphocytes Relative: 40 %
Lymphs Abs: 3.9 10*3/uL (ref 0.7–4.0)
MCH: 30 pg (ref 26.0–34.0)
MCHC: 33.3 g/dL (ref 30.0–36.0)
MCV: 90 fL (ref 80.0–100.0)
Monocytes Absolute: 0.6 10*3/uL (ref 0.1–1.0)
Monocytes Relative: 6 %
Neutro Abs: 5 10*3/uL (ref 1.7–7.7)
Neutrophils Relative %: 52 %
Platelets: 330 10*3/uL (ref 150–400)
RBC: 4.6 MIL/uL (ref 3.87–5.11)
RDW: 12.6 % (ref 11.5–15.5)
WBC: 9.7 10*3/uL (ref 4.0–10.5)
nRBC: 0 % (ref 0.0–0.2)

## 2024-05-17 LAB — URINALYSIS, ROUTINE W REFLEX MICROSCOPIC
Bilirubin Urine: NEGATIVE
Glucose, UA: NEGATIVE mg/dL
Hgb urine dipstick: NEGATIVE
Ketones, ur: NEGATIVE mg/dL
Leukocytes,Ua: NEGATIVE
Nitrite: NEGATIVE
Protein, ur: NEGATIVE mg/dL
Specific Gravity, Urine: 1.026 (ref 1.005–1.030)
pH: 6 (ref 5.0–8.0)

## 2024-05-17 LAB — POCT URINE PREGNANCY: Preg Test, Ur: NEGATIVE

## 2024-05-17 LAB — POCT URINALYSIS DIP (MANUAL ENTRY)
Bilirubin, UA: NEGATIVE
Glucose, UA: NEGATIVE mg/dL
Ketones, POC UA: NEGATIVE mg/dL
Leukocytes, UA: NEGATIVE
Nitrite, UA: NEGATIVE
Protein Ur, POC: NEGATIVE mg/dL
Spec Grav, UA: 1.025
Urobilinogen, UA: 0.2 U/dL
pH, UA: 5.5

## 2024-05-17 LAB — COMPREHENSIVE METABOLIC PANEL WITH GFR
ALT: 18 U/L (ref 0–44)
AST: 20 U/L (ref 15–41)
Albumin: 4.2 g/dL (ref 3.5–5.0)
Alkaline Phosphatase: 43 U/L (ref 38–126)
Anion gap: 9 (ref 5–15)
BUN: 9 mg/dL (ref 6–20)
CO2: 23 mmol/L (ref 22–32)
Calcium: 9.5 mg/dL (ref 8.9–10.3)
Chloride: 108 mmol/L (ref 98–111)
Creatinine, Ser: 0.92 mg/dL (ref 0.44–1.00)
GFR, Estimated: >60 mL/min (ref >60)
Glucose, Bld: 96 mg/dL (ref 70–99)
Potassium: 3.6 mmol/L (ref 3.5–5.1)
Sodium: 140 mmol/L (ref 135–145)
Total Bilirubin: 0.6 mg/dL (ref 0.0–1.2)
Total Protein: 7.1 g/dL (ref 6.5–8.1)

## 2024-05-17 LAB — LIPASE, BLOOD: Lipase: 34 U/L (ref 11–51)

## 2024-05-17 LAB — HCG, SERUM, QUALITATIVE: Preg, Serum: NEGATIVE

## 2024-05-17 MED ORDER — ALUM & MAG HYDROXIDE-SIMETH 200-200-20 MG/5ML PO SUSP
30.0000 mL | Freq: Once | ORAL | Status: AC
  Administered 2024-05-17: 30 mL via ORAL
  Filled 2024-05-17: qty 30

## 2024-05-17 MED ORDER — KETOROLAC TROMETHAMINE 30 MG/ML IJ SOLN: 30.0000 mg | Freq: Once | INTRAMUSCULAR | Status: DC

## 2024-05-17 MED ORDER — LIDOCAINE VISCOUS HCL 2 % MT SOLN
15.0000 mL | Freq: Once | OROMUCOSAL | Status: AC
  Administered 2024-05-17: 15 mL via ORAL
  Filled 2024-05-17: qty 15

## 2024-05-17 MED ORDER — ACETAMINOPHEN 325 MG PO TABS
650.0000 mg | ORAL_TABLET | Freq: Once | ORAL | Status: AC
  Administered 2024-05-17: 650 mg via ORAL
  Filled 2024-05-17: qty 2

## 2024-05-17 MED ORDER — ONDANSETRON 4 MG PO TBDP
4.0000 mg | ORAL_TABLET | Freq: Once | ORAL | Status: AC
  Administered 2024-05-17: 4 mg via ORAL
  Filled 2024-05-17: qty 1

## 2024-05-17 MED ORDER — ONDANSETRON 4 MG PO TBDP: 4.0000 mg | ORAL_TABLET | Freq: Three times a day (TID) | ORAL | 0 refills | Status: AC | PRN

## 2024-05-17 MED ORDER — DICYCLOMINE HCL 20 MG PO TABS: 20.0000 mg | ORAL_TABLET | Freq: Two times a day (BID) | ORAL | 0 refills | Status: AC

## 2024-05-17 MED ORDER — OMEPRAZOLE 20 MG PO CPDR: 20.0000 mg | DELAYED_RELEASE_CAPSULE | Freq: Every day | ORAL | 0 refills | Status: AC

## 2024-05-17 NOTE — Discharge Instructions (Signed)
 As we discussed, your workup in the ER today was reassuring for acute findings.  Laboratory evaluation and ultrasound imaging did not reveal any emergent cause of your symptoms.  However I did recommend you stay for a CT scan of your abdomen to evaluate for other etiologies of your symptoms such as appendicitis given the location of your pain.  However, you have opted to return home instead.  I have given you a prescription for Bentyl  which she can take for stomach cramps as needed, Zofran  you can take for nausea and vomiting as needed and Prilosec which can help with acid reflux if that is the cause of your symptoms.  Please take that first thing in the morning 30 minutes prior to eating anything.  Call your PCP for close follow-up.  Return if development of any new or worsening symptoms.

## 2024-05-17 NOTE — ED Provider Notes (Addendum)
 [facility]  Note:  This document was prepared using Conservation officer, historic buildings and may include unintentional dictation errors.  MRN: [id number] DOB: [birth year]  Subjective:   Wendy Little is a 23 y.o. female presenting for 1 day history of acute onset moderate to severe upper abdominal pain that radiates into her back and up toward her shoulders.  She did have bouts of diarrhea and intermittent nausea, decreased appetite.  She has been able to eat without vomiting.  No fever, respiratory symptoms, urinary symptoms, constipation, bloody stools, vaginal symptoms.  No recent antibiotic use.  No recent hospitalizations.  No history of GI disorders.  No chronic medications.  No Known Allergies  History reviewed. No pertinent past medical history.   History reviewed. No pertinent surgical history.  No family history on file.  Social History   Tobacco Use   Smoking status: Never   Smokeless tobacco: Never  Vaping Use   Vaping status: Former  Substance Use Topics   Alcohol use: No   Drug use: Never    ROS   Objective:   Vitals: BP 111/72 (BP Location: Right Arm)   Pulse 82   Temp 98 F (36.7 C) (Oral)   Resp 16   LMP 05/06/2024   SpO2 96%   Physical Exam Constitutional:      General: She is not in acute distress.    Appearance: Normal appearance. She is well-developed. She is not ill-appearing, toxic-appearing or diaphoretic.  HENT:     Head: Normocephalic and atraumatic.     Nose: Nose normal.     Mouth/Throat:     Mouth: Mucous membranes are moist.  Eyes:     General: No scleral icterus.       Right eye: No discharge.        Left eye: No discharge.     Extraocular Movements: Extraocular movements intact.     Conjunctiva/sclera: Conjunctivae normal.  Cardiovascular:     Rate and Rhythm: Normal rate and regular rhythm.     Heart sounds: Normal heart sounds. No murmur heard.    No friction rub. No gallop.  Pulmonary:      Effort: Pulmonary effort is normal. No respiratory distress.     Breath sounds: No stridor. No wheezing, rhonchi or rales.  Chest:     Chest wall: No tenderness.  Abdominal:     General: Bowel sounds are normal. There is no distension.     Palpations: Abdomen is soft. There is no mass.     Tenderness: There is abdominal tenderness in the right upper quadrant. There is guarding. There is no right CVA tenderness, left CVA tenderness or rebound. Positive signs include Murphy's sign.  Skin:    General: Skin is warm and dry.  Neurological:     General: No focal deficit present.     Mental Status: She is alert and oriented to person, place, and time.  Psychiatric:        Mood and Affect: Mood normal.        Behavior: Behavior normal.        Thought Content: Thought content normal.        Judgment: Judgment normal.    Results for orders placed or performed during the hospital encounter of 05/17/24 (from the past 24 hours)  POCT urinalysis dipstick     Status: Abnormal   Collection Time: 05/17/24  2:58 PM  Result Value Ref Range   Color, UA yellow    Clarity,  UA clear    Glucose, UA negative mg/dL   Bilirubin, UA negative    Ketones, POC UA negative mg/dL   Spec Grav, UA 0.981    Blood, UA large (A)    pH, UA 5.5    Protein Ur, POC negative mg/dL   Urobilinogen, UA 0.2 E.U./dL   Nitrite, UA Negative    Leukocytes, UA Negative   POCT urine pregnancy     Status: Normal   Collection Time: 05/17/24  2:59 PM  Result Value Ref Range   Preg Test, Ur Negative     Assessment and Plan :   PDMP not reviewed this encounter.  1. Upper abdominal pain   2. Screen for STD (sexually transmitted disease)    Patient warrants further care than we can provide in the urgent care setting.  Differential includes acute gallbladder disease, acute hepatitis, acute pancreatitis, peptic ulcer disease, acute abdomen, obstructive uropathy among other intra-abdominal medical emergencies.  I discussed this  with the patient and she is agreeable to present to the emergency room now.    Adolph Hoop, [place] 05/17/24 1521

## 2024-05-17 NOTE — ED Provider Triage Note (Signed)
 Emergency Medicine Provider Triage Evaluation Note  Wendy Little , a 23 y.o. female  was evaluated in triage.  Pt complains of epigastric pain, right upper quadrant pain, chest pain that began today.  Patient that she had diarrhea yesterday and has felt nauseous all day today.  Patient states she also has a headache but feels a band around her head.  Patient said she has not had much to eat or drink and tried headache medicine over-the-counter did not help.  Patient Nuys any fevers or emesis but states she feels nauseous.  Patient Nuys any shortness of breath..  Review of Systems  Positive:  Negative:   Physical Exam  BP 121/66 (BP Location: Right Arm)   Pulse 76   Temp 98.5 F (36.9 C)   Resp 18   Ht 5\' 5"  (1.651 m)   Wt 106.6 kg   LMP 05/06/2024 (Exact Date)   SpO2 98%   BMI 39.11 kg/m  Gen:   Awake, no distress   Resp:  Normal effort  MSK:   Moves extremities without difficulty  Other:  Right upper quadrant tenderness without peritoneal signs  Medical Decision Making  Medically screening exam initiated at 6:51 PM.  Appropriate orders placed.  Wendy Little was informed that the remainder of the evaluation will be completed by another provider, this initial triage assessment does not replace that evaluation, and the importance of remaining in the ED until their evaluation is complete.  Workup initiated, patient stable.   [name], [place] 05/17/24 [phone]

## 2024-05-17 NOTE — ED Notes (Signed)
 Pt refused injection-advised of need to go to ED

## 2024-05-17 NOTE — ED Provider Notes (Signed)
 [place] EMERGENCY DEPARTMENT AT [facility] Provider Note   CSN: [id number] Arrival date & time: 05/17/24  1801     History  Chief Complaint  Patient presents with   Chest Pain    Suzane Vanderweide is a 23 y.o. female.  Patient with no pertinent past medical history presents today with complaints of abdominal pain. She states that same began earlier this afternoon and has been persistent since. Pain is in her right upper quadrant, epigastric region as well as her right lower quadrant. Also feels like the pain radiates into her chest. Endorses some nausea without vomiting and some diarrhea as well. Denies shortness of breath.  No recent travel or surgeries.  No leg pain or leg swelling. She has eaten since her pain began without vomiting or worsening of pain. No fevers or chills. Does note that she has had similar pain previously but was not evaluated before. Denies dysuria or hematuria, no vaginal discharge. No history of abdominal surgeries. She has not tried anything for her symptoms. Went to urgent care and was sent here for evaluation.  The history is provided by the patient. No language interpreter was used.  Chest Pain Associated symptoms: abdominal pain and nausea        Home Medications Prior to Admission medications   Medication Sig Start Date End Date Taking? Authorizing Provider  cetirizine  (ZYRTEC  ALLERGY) 10 MG tablet Take 1 tablet (10 mg total) by mouth daily. 03/28/24   Adolph Hoop, PA-C  estradiol (ESTRACE) 2 MG tablet Take 1 tablet by mouth daily. 03/14/23 04/13/23  [provider]  etonogestrel (NEXPLANON) 68 MG IMPL implant 68 mg by subdermal route once.    [provider]  promethazine -dextromethorphan (PROMETHAZINE -DM) 6.25-15 MG/5ML syrup Take 5 mLs by mouth 3 (three) times daily as needed for cough. 03/28/24   Adolph Hoop, PA-C  pseudoephedrine  (SUDAFED) 60 MG tablet Take 1 tablet (60 mg total) by mouth every 8 (eight) hours as needed for  congestion. 03/28/24   Adolph Hoop, PA-C      Allergies    Patient has no known allergies.    Review of Systems   Review of Systems  Gastrointestinal:  Positive for abdominal pain, diarrhea and nausea.  All other systems reviewed and are negative.   Physical Exam Updated Vital Signs BP 121/66 (BP Location: Right Arm)   Pulse 76   Temp 98.5 F (36.9 C)   Resp 18   Ht 5\' 5"  (1.651 m)   Wt 106.6 kg   LMP 05/06/2024 (Exact Date)   SpO2 98%   BMI 39.11 kg/m  Physical Exam Vitals and nursing note reviewed.  Constitutional:      General: She is not in acute distress.    Appearance: Normal appearance. She is normal weight. She is not ill-appearing, toxic-appearing or diaphoretic.  HENT:     Head: Normocephalic and atraumatic.  Cardiovascular:     Rate and Rhythm: Normal rate.  Pulmonary:     Effort: Pulmonary effort is normal. No respiratory distress.  Abdominal:     General: Abdomen is flat.     Palpations: Abdomen is soft.     Tenderness: There is abdominal tenderness in the right upper quadrant, right lower quadrant and epigastric area.  Musculoskeletal:        General: No tenderness. Normal range of motion.     Cervical back: Normal range of motion.     Right lower leg: No edema.     Left lower leg: No  edema.  Skin:    General: Skin is warm and dry.  Neurological:     General: No focal deficit present.     Mental Status: She is alert.  Psychiatric:        Mood and Affect: Mood normal.        Behavior: Behavior normal.     ED Results / Procedures / Treatments   Labs (all labs ordered are listed, but only abnormal results are displayed) Labs Reviewed  URINALYSIS, ROUTINE W REFLEX MICROSCOPIC - Abnormal; Notable for the following components:      Result Value   APPearance HAZY (*)    Bacteria, UA RARE (*)    All other components within normal limits  CBC WITH DIFFERENTIAL/PLATELET  COMPREHENSIVE METABOLIC PANEL WITH GFR  LIPASE, BLOOD  HCG, SERUM,  QUALITATIVE    EKG None  Radiology US  Abdomen Limited RUQ (LIVER/GB) Result Date: 05/17/2024 CLINICAL DATA:  Right upper quadrant pain EXAM: ULTRASOUND ABDOMEN LIMITED RIGHT UPPER QUADRANT COMPARISON:  None FINDINGS: Gallbladder: Contracted appearing gallbladder. No shadowing stones. Negative sonographic Murphy Common bile duct: Diameter: 4.2 mm Liver: Slightly echogenic liver parenchyma. No focal hepatic abnormality. Portal vein is patent on color Doppler imaging with normal direction of blood flow towards the liver. Other: None. IMPRESSION: 1. Contracted gallbladder. No shadowing stones or biliary dilatation. 2. Slightly echogenic liver parenchyma suggesting fatty infiltration. Electronically Signed   By: Esmeralda Hedge M.D.   On: 05/17/2024 21:10    Procedures Procedures    Medications Ordered in ED Medications  alum & mag hydroxide-simeth (MAALOX/MYLANTA) 200-200-20 MG/5ML suspension 30 mL (30 mLs Oral Given 05/17/24 1903)    And  lidocaine  (XYLOCAINE ) 2 % viscous mouth solution 15 mL (15 mLs Oral Given 05/17/24 1907)  ondansetron (ZOFRAN-ODT) disintegrating tablet 4 mg (4 mg Oral Given 05/17/24 1904)  acetaminophen  (TYLENOL ) tablet 650 mg (650 mg Oral Given 05/17/24 1903)    ED Course/ Medical Decision Making/ A&P                                 Medical Decision Making  This patient is a 23 y.o. female who presents to the ED for concern of abdominal pain, this involves an extensive number of treatment options, and is a complaint that carries with it a high risk of complications and morbidity. The emergent differential diagnosis prior to evaluation includes, but is not limited to,  The differential diagnosis for generalized abdominal pain includes, but is not limited to AAA, gastroenteritis, appendicitis, Bowel obstruction, Bowel perforation. Gastroparesis, DKA, Hernia, Inflammatory bowel disease, mesenteric ischemia, pancreatitis, peritonitis SBP, volvulus.  This is not an exhaustive  differential.   Past Medical History / Co-morbidities / Social History:  has no past medical history on file.  Additional history: Chart reviewed. Pertinent results include: sent from urgent care   Physical Exam: Physical exam performed. The pertinent findings include: RUQ, epigastric, and RLQ TTP, no rebound or guarding  Lab Tests: I ordered, and personally interpreted labs.  The pertinent results include:  no acute laboratory abnormalities  Imaging Studies: I ordered imaging studies including RUQ US . I independently visualized and interpreted imaging which showed   1. Contracted gallbladder. No shadowing stones or biliary dilatation. 2. Slightly echogenic liver parenchyma suggesting fatty infiltration.  I agree with the radiologist interpretation.   Medications: Tylenol , GI cocktail, and zofran ordered in triage. Reevaluation of the patient after these medicines showed that the patient improved.  I have reviewed the patients home medicines and have made adjustments as needed.  Disposition: After consideration of the diagnostic results and the patients response to treatment, I feel that emergency department workup does not suggest an emergent condition requiring admission or immediate intervention beyond what has been performed at this time. The plan is: discharge with zofran, prilosec, and bentyl for pain, close outpatient follow-up and return precautions. Did recommend patient have CT abdomen given her pain includes her RLQ to rule out other causes of symptoms including appendicitis. However, patient would prefer to return home with close return precautions. She does note some improvement in symptoms with above interventions.  Evaluation and diagnostic testing in the emergency department does not suggest an emergent condition requiring admission or immediate intervention beyond what has been performed at this time.  Plan for discharge with close PCP follow-up.  Patient is understanding  and amenable with plan, educated on red flag symptoms that would prompt immediate return.  Patient discharged in stable condition.  Final Clinical Impression(s) / ED Diagnoses Final diagnoses:  Generalized abdominal pain    Rx / DC Orders ED Discharge Orders          Ordered    omeprazole (PRILOSEC) 20 MG capsule  Daily        05/17/24 2157    dicyclomine (BENTYL) 20 MG tablet  2 times daily        05/17/24 2157    ondansetron (ZOFRAN-ODT) 4 MG disintegrating tablet  Every 8 hours PRN        05/17/24 2157          An After Visit Summary was printed and given to the patient.     Sherra Dk, PA-C 05/17/24 2201    Rolinda Climes, DO 05/17/24 2311

## 2024-05-17 NOTE — ED Notes (Signed)
Return from US

## 2024-05-17 NOTE — ED Triage Notes (Signed)
 Pt reports feeling "weird" today when she was at work, describes pinching/shooting pain in her abdomen that radiated up into her chest. She had pain with inspiration. She was sent bu urgent care. Diarrhea and headache yesterday.

## 2024-05-17 NOTE — ED Notes (Signed)
 Patient is being discharged from the Urgent Care and sent to the Emergency Department via POV . Per Pinkey Brier, PA-C, patient is in need of higher level of care due to abd pain. Patient is aware and verbalizes understanding of plan of care.  Vitals:   05/17/24 1444  BP: 111/72  Pulse: 82  Resp: 16  Temp: 98 F (36.7 C)  SpO2: 96%

## 2024-05-17 NOTE — Discharge Instructions (Addendum)
 Your pregnancy test is negative and your urine test does not suggest any kind of kidney infection.  As such, you are in need of a higher level than we can provide in the urgent care setting.  This includes testing to rule out an acute abdomen, acute gallbladder issue, acute hepatitis, pancreatitis, choledocholithiasis among other medical emergencies.  Please head straight to the emergency room now.

## 2024-05-17 NOTE — ED Triage Notes (Signed)
 Pt c/o epigastric pain x today-c/o HA, feeling weak and hot while at work yesterday-no pain meds PTA-NAD-steady gait

## 2024-05-21 LAB — CERVICOVAGINAL ANCILLARY ONLY
Chlamydia: NEGATIVE
Comment: NEGATIVE
Comment: NEGATIVE
Comment: NORMAL
Neisseria Gonorrhea: NEGATIVE
Trichomonas: NEGATIVE

## 2024-07-22 ENCOUNTER — Ambulatory Visit
Admission: EM | Admit: 2024-07-22 | Discharge: 2024-07-22 | Disposition: A | Discharge status: Home / Self Care | Payer: Self-pay | Attending: Family Medicine | Admitting: Family Medicine

## 2024-07-22 DIAGNOSIS — K120 Recurrent oral aphthae: Secondary | ICD-10-CM | POA: Insufficient documentation

## 2024-07-22 DIAGNOSIS — R070 Pain in throat: Secondary | ICD-10-CM | POA: Insufficient documentation

## 2024-07-22 DIAGNOSIS — J069 Acute upper respiratory infection, unspecified: Secondary | ICD-10-CM | POA: Insufficient documentation

## 2024-07-22 LAB — POCT RAPID STREP A (OFFICE): Rapid Strep A Screen: NEGATIVE

## 2024-07-22 MED ORDER — CETIRIZINE HCL 10 MG PO TABS: 10.0000 mg | ORAL_TABLET | Freq: Every day | ORAL | 0 refills | Status: AC

## 2024-07-22 MED ORDER — DEXAMETHASONE 0.5 MG/5ML PO ELIX: ORAL_SOLUTION | ORAL | 0 refills | Status: AC

## 2024-07-22 MED ORDER — PSEUDOEPHEDRINE HCL 60 MG PO TABS: 60.0000 mg | ORAL_TABLET | Freq: Three times a day (TID) | ORAL | 0 refills | Status: AC | PRN

## 2024-07-22 NOTE — ED Provider Notes (Signed)
 [facility] - URGENT CARE CENTER  Note:  This document was prepared using Conservation officer, historic buildings and may include unintentional dictation errors.  MRN: [id number] DOB: [birth year]  Subjective:   Wendy Little is a 23 y.o. female presenting for 3-day history of throat pain, painful swallowing, excessive saliva, canker sore at the roof of her mouth, intermittent dry cough. No smoking of any kind including cigarettes, cigars, vaping, marijuana use.  No asthma.  Patient has concerns that the last time this happened she was out of work for 2 weeks.  No fever, sinus pain, ear pain, chest pain, shortness of breath, wheezing.  No dental pain.  No current facility-administered medications for this encounter.  Current Outpatient Medications:    cetirizine  (ZYRTEC  ALLERGY) 10 MG tablet, Take 1 tablet (10 mg total) by mouth daily., Disp: 30 tablet, Rfl: 0   dicyclomine  (BENTYL ) 20 MG tablet, Take 1 tablet (20 mg total) by mouth 2 (two) times daily., Disp: 20 tablet, Rfl: 0   estradiol (ESTRACE) 2 MG tablet, Take 1 tablet by mouth daily., Disp: , Rfl:    etonogestrel (NEXPLANON) 68 MG IMPL implant, 68 mg by subdermal route once., Disp: , Rfl:    omeprazole  (PRILOSEC) 20 MG capsule, Take 1 capsule (20 mg total) by mouth daily., Disp: 14 capsule, Rfl: 0   ondansetron  (ZOFRAN -ODT) 4 MG disintegrating tablet, Take 1 tablet (4 mg total) by mouth every 8 (eight) hours as needed for nausea or vomiting., Disp: 20 tablet, Rfl: 0   promethazine -dextromethorphan (PROMETHAZINE -DM) 6.25-15 MG/5ML syrup, Take 5 mLs by mouth 3 (three) times daily as needed for cough., Disp: 200 mL, Rfl: 0   pseudoephedrine  (SUDAFED) 60 MG tablet, Take 1 tablet (60 mg total) by mouth every 8 (eight) hours as needed for congestion., Disp: 30 tablet, Rfl: 0   No Known Allergies  History reviewed. No pertinent past medical history.   History reviewed. No pertinent surgical history.  History reviewed. No pertinent family  history.  Social History   Tobacco Use   Smoking status: Never   Smokeless tobacco: Never  Vaping Use   Vaping status: Former  Substance Use Topics   Alcohol use: No   Drug use: Never    ROS   Objective:   Vitals: BP 113/79 (BP Location: Right Arm)   Pulse 75   Temp 98.6 F (37 C) (Oral)   Resp 16   LMP 06/27/2024 (Exact Date)   SpO2 97%   Physical Exam Constitutional:      General: She is not in acute distress.    Appearance: Normal appearance. She is well-developed and normal weight. She is not ill-appearing, toxic-appearing or diaphoretic.  HENT:     Head: Normocephalic and atraumatic.     Right Ear: Tympanic membrane, ear canal and external ear normal. No drainage or tenderness. No middle ear effusion. There is no impacted cerumen. Tympanic membrane is not erythematous or bulging.     Left Ear: Tympanic membrane, ear canal and external ear normal. No drainage or tenderness.  No middle ear effusion. There is no impacted cerumen. Tympanic membrane is not erythematous or bulging.     Nose: Nose normal. No congestion or rhinorrhea.     Mouth/Throat:     Mouth: Mucous membranes are moist. No oral lesions.     Pharynx: No pharyngeal swelling, oropharyngeal exudate, posterior oropharyngeal erythema or uvula swelling.     Tonsils: No tonsillar exudate or tonsillar abscesses. 0 on the right. 0 on the left.  Comments: Thick streaks of postnasal drainage overlying pharynx.  Eyes:     General: No scleral icterus.       Right eye: No discharge.        Left eye: No discharge.     Extraocular Movements: Extraocular movements intact.     Right eye: Normal extraocular motion.     Left eye: Normal extraocular motion.     Conjunctiva/sclera: Conjunctivae normal.  Cardiovascular:     Rate and Rhythm: Normal rate and regular rhythm.     Heart sounds: Normal heart sounds. No murmur heard.    No friction rub. No gallop.  Pulmonary:     Effort: Pulmonary effort is normal. No  respiratory distress.     Breath sounds: No stridor. No wheezing, rhonchi or rales.  Chest:     Chest wall: No tenderness.  Musculoskeletal:     Cervical back: Normal range of motion and neck supple.  Lymphadenopathy:     Cervical: No cervical adenopathy.  Skin:    General: Skin is warm and dry.  Neurological:     General: No focal deficit present.     Mental Status: She is alert and oriented to person, place, and time.  Psychiatric:        Mood and Affect: Mood normal.        Behavior: Behavior normal.     Results for orders placed or performed during the hospital encounter of 07/22/24 (from the past 24 hours)  POCT rapid strep A     Status: None   Collection Time: 07/22/24  2:22 PM  Result Value Ref Range   Rapid Strep A Screen Negative Negative    Assessment and Plan :   PDMP not reviewed this encounter.  1. Viral upper respiratory infection   2. Aphthous ulcer   3. Throat pain    Strep culture pending.  Suspect viral URI, viral syndrome. Physical exam findings reassuring and vital signs stable for discharge. Advised supportive care, offered symptomatic relief.  Recommended dexamethasone  elixir for the aphthous ulcer.  Counseled patient on potential for adverse effects with medications prescribed/recommended today, ER and return-to-clinic precautions discussed, patient verbalized understanding.     Christopher Savannah, [place] 07/22/24 1438

## 2024-07-22 NOTE — ED Triage Notes (Signed)
 Pt reports sore throat started today; canker sore in the mouth x 3 days. P has not tried any meds for complaints.

## 2024-07-22 NOTE — Discharge Instructions (Addendum)
 For the canker sore, use 5 mL swish and spit three times daily. It is important to keep the medication in the mouth for five minutes prior to spitting it out. Do not rinse afterward and avoid eating or drinking for 30 minutes.   We will manage this as a viral illness. For sore throat or cough try using a honey-based tea. Use 3 teaspoons of honey with juice squeezed from half lemon. Place shaved pieces of ginger into 1/2-1 cup of water and warm over stove top. Then mix the ingredients and repeat every 4 hours as needed. Please take ibuprofen  600mg  every 6 hours with food alternating with OR taken together with Tylenol  500mg -650mg  every 6 hours for throat pain, fevers, aches and pains. Hydrate very well with at least 2 liters of water. Eat light meals such as soups (chicken and noodles, vegetable, chicken and wild rice).  Do not eat foods that you are allergic to.  Taking an antihistamine like Zyrtec  (10mg  daily) can help against postnasal drainage, sinus congestion which can cause sinus pain, sinus headaches, throat pain, painful swallowing, coughing.  You can take this together with pseudoephedrine  (Sudafed) at a dose of 60 mg 3 times a day or twice daily as needed for the same kind of post-nasal drip, congestion.

## 2024-07-25 ENCOUNTER — Ambulatory Visit (HOSPITAL_COMMUNITY): Payer: Self-pay

## 2024-07-25 LAB — CULTURE, GROUP A STREP (THRC)

## 2024-08-12 ENCOUNTER — Ambulatory Visit
Admission: EM | Admit: 2024-08-12 | Discharge: 2024-08-12 | Disposition: A | Discharge status: Home / Self Care | Payer: Self-pay | Attending: Family Medicine | Admitting: Family Medicine

## 2024-08-12 ENCOUNTER — Other Ambulatory Visit: Payer: Self-pay

## 2024-08-12 DIAGNOSIS — Z113 Encounter for screening for infections with a predominantly sexual mode of transmission: Secondary | ICD-10-CM | POA: Insufficient documentation

## 2024-08-12 DIAGNOSIS — R300 Dysuria: Secondary | ICD-10-CM | POA: Insufficient documentation

## 2024-08-12 DIAGNOSIS — N3001 Acute cystitis with hematuria: Secondary | ICD-10-CM | POA: Insufficient documentation

## 2024-08-12 LAB — POCT URINE DIPSTICK
Bilirubin, UA: NEGATIVE
Glucose, UA: NEGATIVE mg/dL
Ketones, POC UA: NEGATIVE mg/dL
Nitrite, UA: NEGATIVE
POC PROTEIN,UA: 30 — AB
Spec Grav, UA: 1.02
Urobilinogen, UA: 0.2 U/dL
pH, UA: 7.5

## 2024-08-12 LAB — POCT URINE PREGNANCY: Preg Test, Ur: NEGATIVE

## 2024-08-12 MED ORDER — NITROFURANTOIN MONOHYD MACRO 100 MG PO CAPS
100.0000 mg | ORAL_CAPSULE | Freq: Two times a day (BID) | ORAL | 0 refills | Status: AC
Start: 2024-08-12 — End: 2024-08-19

## 2024-08-12 NOTE — ED Provider Notes (Signed)
 [facility]    CSN: [id number] Arrival date & time: 08/12/24  9163      History   Chief Complaint No chief complaint on file.   HPI Wendy Little is a 23 y.o. female presents for dysuria.  Patient reports 6 days of urinary burning, urgency, frequency.  Denies hematuria, fevers, nausea/vomiting, flank pain.  No vaginal discharge or STD concern but would like screening while here.  Denies history of recurrent UTIs.  Has taken Azo OTC with some improvement.  No other concerns at this time.  HPI  History reviewed. No pertinent past medical history.  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 No pertinent surgical history.  OB History   No obstetric history on file.      Home Medications    Prior to Admission medications   Medication Sig Start Date End Date Taking? Authorizing Provider  nitrofurantoin , macrocrystal-monohydrate, (MACROBID ) 100 MG capsule Take 1 capsule (100 mg total) by mouth 2 (two) times daily for 7 days. 08/12/24 08/19/24 Yes Cleve Paolillo, Jodi R, NP  cetirizine  (ZYRTEC  ALLERGY) 10 MG tablet Take 1 tablet (10 mg total) by mouth daily. 07/22/24   Christopher Savannah, PA-C  dexamethasone  0.5 MG/5ML elixir Use 5 mL swish and spit three times daily. 07/22/24   Christopher Savannah, PA-C  dicyclomine  (BENTYL ) 20 MG tablet Take 1 tablet (20 mg total) by mouth 2 (two) times daily. 05/17/24   Smoot, Lauraine LABOR, PA-C  estradiol (ESTRACE) 2 MG tablet Take 1 tablet by mouth daily. 03/14/23 04/13/23  [provider]  etonogestrel (NEXPLANON) 68 MG IMPL implant 68 mg by subdermal route once.    [provider]  omeprazole  (PRILOSEC) 20 MG capsule Take 1 capsule (20 mg total) by mouth daily. 05/17/24   Smoot, Lauraine LABOR, PA-C  ondansetron  (ZOFRAN -ODT) 4 MG  disintegrating tablet Take 1 tablet (4 mg total) by mouth every 8 (eight) hours as needed for nausea or vomiting. 05/17/24   Smoot, Lauraine LABOR, PA-C  promethazine -dextromethorphan (PROMETHAZINE -DM) 6.25-15 MG/5ML syrup Take 5 mLs by mouth 3 (three) times daily as needed for cough. 03/28/24   Christopher Savannah, PA-C  pseudoephedrine  (SUDAFED) 60 MG tablet Take 1 tablet (60 mg total) by mouth every 8 (eight) hours as needed for congestion. 07/22/24   Christopher Savannah, PA-C    Family History History reviewed. No pertinent family history.  Social History Social History   Tobacco Use   Smoking status: Never   Smokeless tobacco: Never  Vaping Use   Vaping status: Former  Substance Use Topics   Alcohol use: No   Drug use: Never     Allergies   Patient has no known allergies.   Review of Systems Review of Systems  Genitourinary:  Positive for dysuria.     Physical Exam Triage Vital Signs ED Triage Vitals  Encounter Vitals Group     BP 08/12/24 0846 106/74     Girls Systolic BP Percentile --      Girls Diastolic BP Percentile --      Boys Systolic BP Percentile --      Boys Diastolic BP Percentile --      Pulse Rate 08/12/24 0846 71     Resp 08/12/24 0846 16     Temp 08/12/24 0846 97.8 F (36.6 C)  Temp Source 08/12/24 0846 Oral     SpO2 08/12/24 0846 95 %     Weight --      Height --      Head Circumference --      Peak Flow --      Pain Score 08/12/24 0844 3     Pain Loc --      Pain Education --      Exclude from Growth Chart --    No data found.  Updated Vital Signs BP 106/74   Pulse 71   Temp 97.8 F (36.6 C) (Oral)   Resp 16   LMP 07/23/2024 (Exact Date)   SpO2 95%   Visual Acuity Right Eye Distance:   Left Eye Distance:   Bilateral Distance:    Right Eye Near:   Left Eye Near:    Bilateral Near:     Physical Exam Vitals and nursing note reviewed.  Constitutional:      Appearance: Normal appearance.  HENT:     Head: Normocephalic and atraumatic.  Eyes:      Pupils: Pupils are equal, round, and reactive to light.  Cardiovascular:     Rate and Rhythm: Normal rate.  Pulmonary:     Effort: Pulmonary effort is normal.  Abdominal:     Tenderness: There is no right CVA tenderness or left CVA tenderness.  Skin:    General: Skin is warm and dry.  Neurological:     General: No focal deficit present.     Mental Status: She is alert and oriented to person, place, and time.  Psychiatric:        Mood and Affect: Mood normal.        Behavior: Behavior normal.      UC Treatments / Results  Labs (all labs ordered are listed, but only abnormal results are displayed) Labs Reviewed  POCT URINE DIPSTICK - Abnormal; Notable for the following components:      Result Value   Clarity, UA hazy (*)    Blood, UA small (*)    POC PROTEIN,UA =30 (*)    Leukocytes, UA Small (1+) (*)    All other components within normal limits  URINE CULTURE  POCT URINE PREGNANCY  CERVICOVAGINAL ANCILLARY ONLY    EKG   Radiology No results found.  Procedures Procedures (including critical care time)  Medications Ordered in UC Medications - No data to display  Initial Impression / Assessment and Plan / UC Course  I have reviewed the triage vital signs and the nursing notes.  Pertinent labs & imaging results that were available during my care of the patient were reviewed by me and considered in my medical decision making (see chart for details).     Reviewed exam and symptoms with patient.  No red flags.  Urine positive for UTI, will send urine culture and start Macrobid .  STD testing is ordered and will contact for any positive results.  Encourage rest fluids and PCP follow-up if symptoms do not improve.  ER precautions reviewed Final Clinical Impressions(s) / UC Diagnoses   Final diagnoses:  Dysuria  Acute cystitis with hematuria  Screening examination for STD (sexually transmitted disease)     Discharge Instructions      The clinical contact  you with results of the testing done today if positive.  Start Macrobid  twice daily for 7 days.  Lots of rest and fluids.  Please follow-up with your PCP if your symptoms do not improve.  Please go to  the ER for any worsening symptoms.  Hope you feel better soon!   ED Prescriptions     Medication Sig Dispense Auth. Provider   nitrofurantoin , macrocrystal-monohydrate, (MACROBID ) 100 MG capsule Take 1 capsule (100 mg total) by mouth 2 (two) times daily for 7 days. 14 capsule Jerrid Forgette, Jodi R, NP      PDMP not reviewed this encounter.   Loreda Myla SAUNDERS, NP 08/12/24 [phone]

## 2024-08-12 NOTE — Discharge Instructions (Signed)
 The clinical contact you with results of the testing done today if positive.  Start Macrobid  twice daily for 7 days.  Lots of rest and fluids.  Please follow-up with your PCP if your symptoms do not improve.  Please go to the ER for any worsening symptoms.  Hope you feel better soon!

## 2024-08-12 NOTE — ED Triage Notes (Signed)
 Pt c/o dysuriax6d. Pelvic pain w/heavy lifting at job on Friday. Pt states she has been taking Azo and it has helped some, but still having sx

## 2024-08-13 LAB — CERVICOVAGINAL ANCILLARY ONLY
Chlamydia: NEGATIVE
Comment: NEGATIVE
Comment: NEGATIVE
Comment: NORMAL
Neisseria Gonorrhea: NEGATIVE
Trichomonas: NEGATIVE

## 2024-08-14 ENCOUNTER — Ambulatory Visit (HOSPITAL_COMMUNITY): Payer: Self-pay

## 2024-08-14 LAB — URINE CULTURE: Culture: >=100000 — AB

## 2024-08-29 ENCOUNTER — Other Ambulatory Visit: Payer: Self-pay | Admitting: Family Medicine

## 2024-08-29 DIAGNOSIS — M25532 Pain in left wrist: Secondary | ICD-10-CM

## 2024-09-04 ENCOUNTER — Ambulatory Visit
Admission: EM | Admit: 2024-09-04 | Discharge: 2024-09-04 | Disposition: A | Discharge status: Home / Self Care | Payer: Self-pay | Attending: Family Medicine | Admitting: Family Medicine

## 2024-09-04 DIAGNOSIS — J029 Acute pharyngitis, unspecified: Secondary | ICD-10-CM

## 2024-09-04 DIAGNOSIS — B349 Viral infection, unspecified: Secondary | ICD-10-CM

## 2024-09-04 LAB — POC SOFIA SARS ANTIGEN FIA: SARS Coronavirus 2 Ag: NEGATIVE

## 2024-09-04 LAB — POCT RAPID STREP A (OFFICE): Rapid Strep A Screen: NEGATIVE

## 2024-09-04 NOTE — ED Triage Notes (Signed)
 Pt present with sore throat and dry cough x yesterday. Denies fever, chills Home interventions: cough medicine States her fiance was sick on Friday.

## 2024-09-04 NOTE — ED Provider Notes (Signed)
 [facility]    CSN: [id number] Arrival date & time: 09/04/24  1746      History   Chief Complaint Chief Complaint  Patient presents with   Sore Throat   Cough    HPI Wendy Little is a 23 y.o. female  presents for evaluation of URI symptoms for 1 days. Patient reports associated symptoms of cough, congestion, sore throat, mild diarrhea. Denies nausea/vomiting, fevers, ear pain, body aches, shortness of breath. Patient does not have a hx of asthma. Patient is not an active smoker.   Reports fianc has similar symptoms.  Pt has taken cough medicine OTC for symptoms. Pt has no other concerns at this time.    Sore Throat  Cough Associated symptoms: sore throat     History reviewed. No pertinent past medical history.  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 No pertinent surgical history.  OB History   No obstetric history on file.      Home Medications    Prior to Admission medications   Medication Sig Start Date End Date Taking? Authorizing Provider  cetirizine  (ZYRTEC  ALLERGY) 10 MG tablet Take 1 tablet (10 mg total) by mouth daily. 07/22/24   Christopher Savannah, PA-C  dexamethasone  0.5 MG/5ML elixir Use 5 mL swish and spit three times daily. 07/22/24   Christopher Savannah, PA-C  dicyclomine  (BENTYL ) 20 MG tablet Take 1 tablet (20 mg total) by mouth 2 (two) times daily. 05/17/24   Smoot, Lauraine LABOR, PA-C  estradiol (ESTRACE) 2 MG tablet Take 1 tablet by mouth daily. 03/14/23 04/13/23  [provider]  etonogestrel (NEXPLANON) 68 MG IMPL implant 68 mg by subdermal route once.    [provider]  omeprazole  (PRILOSEC) 20 MG capsule Take 1 capsule (20 mg total) by mouth daily. 05/17/24   Smoot, Lauraine LABOR, PA-C  ondansetron  (ZOFRAN -ODT) 4 MG  disintegrating tablet Take 1 tablet (4 mg total) by mouth every 8 (eight) hours as needed for nausea or vomiting. 05/17/24   Smoot, Lauraine LABOR, PA-C  promethazine -dextromethorphan (PROMETHAZINE -DM) 6.25-15 MG/5ML syrup Take 5 mLs by mouth 3 (three) times daily as needed for cough. 03/28/24   Christopher Savannah, PA-C  pseudoephedrine  (SUDAFED) 60 MG tablet Take 1 tablet (60 mg total) by mouth every 8 (eight) hours as needed for congestion. 07/22/24   Christopher Savannah, PA-C    Family History History reviewed. No pertinent family history.  Social History Social History   Tobacco Use   Smoking status: Never   Smokeless tobacco: Never  Vaping Use   Vaping status: Former  Substance Use Topics   Alcohol use: No   Drug use: Never     Allergies   Patient has no known allergies.   Review of Systems Review of Systems  HENT:  Positive for congestion and sore throat.   Respiratory:  Positive for cough.      Physical Exam Triage Vital Signs ED Triage Vitals  Encounter Vitals Group     BP 09/04/24 1813 103/66     Girls Systolic BP Percentile --      Girls Diastolic BP Percentile --      Boys Systolic BP Percentile --      Boys Diastolic BP Percentile --      Pulse Rate 09/04/24 1812 78  Resp 09/04/24 1812 17     Temp 09/04/24 1812 97.8 F (36.6 C)     Temp Source 09/04/24 1812 Oral     SpO2 09/04/24 1812 97 %     Weight --      Height --      Head Circumference --      Peak Flow --      Pain Score 09/04/24 1811 3     Pain Loc --      Pain Education --      Exclude from Growth Chart --    No data found.  Updated Vital Signs BP 103/66 (BP Location: Right Arm)   Pulse 78   Temp 97.8 F (36.6 C) (Oral)   Resp 17   LMP 08/19/2024 (Exact Date)   SpO2 97%   Visual Acuity Right Eye Distance:   Left Eye Distance:   Bilateral Distance:    Right Eye Near:   Left Eye Near:    Bilateral Near:     Physical Exam Vitals and nursing note reviewed.  Constitutional:      General: She is  not in acute distress.    Appearance: She is well-developed. She is not ill-appearing.  HENT:     Head: Normocephalic and atraumatic.     Right Ear: Tympanic membrane and ear canal normal.     Left Ear: Tympanic membrane and ear canal normal.     Nose: Congestion present.     Mouth/Throat:     Mouth: Mucous membranes are moist.     Pharynx: Oropharynx is clear. Uvula midline. Posterior oropharyngeal erythema present.     Tonsils: No tonsillar exudate or tonsillar abscesses.  Eyes:     Conjunctiva/sclera: Conjunctivae normal.     Pupils: Pupils are equal, round, and reactive to light.  Cardiovascular:     Rate and Rhythm: Normal rate and regular rhythm.     Heart sounds: Normal heart sounds.  Pulmonary:     Effort: Pulmonary effort is normal.     Breath sounds: Normal breath sounds.  Musculoskeletal:     Cervical back: Normal range of motion and neck supple.  Lymphadenopathy:     Cervical: No cervical adenopathy.  Skin:    General: Skin is warm and dry.  Neurological:     General: No focal deficit present.     Mental Status: She is alert and oriented to person, place, and time.  Psychiatric:        Mood and Affect: Mood normal.        Behavior: Behavior normal.      UC Treatments / Results  Labs (all labs ordered are listed, but only abnormal results are displayed) Labs Reviewed  POCT RAPID STREP A (OFFICE)  POC SOFIA SARS ANTIGEN FIA    EKG   Radiology No results found.  Procedures Procedures (including critical care time)  Medications Ordered in UC Medications - No data to display  Initial Impression / Assessment and Plan / UC Course  I have reviewed the triage vital signs and the nursing notes.  Pertinent labs & imaging results that were available during my care of the patient were reviewed by me and considered in my medical decision making (see chart for details).     Reviewed exam and symptoms with patient.  No red flags.  Negative COVID and strep  throat testing.  Discussed viral illness and symptomatic treatment.  She declined Rx cough medicine.  Discussed rest fluids and PCP follow-up if symptoms  do not improve.  ER precautions reviewed. Final Clinical Impressions(s) / UC Diagnoses   Final diagnoses:  Sore throat  Viral illness     Discharge Instructions      You tested negative for COVID and strep throat.  Please treat your symptoms with over the counter cough medication, tylenol  or ibuprofen , humidifier, and rest. Viral illnesses can last 7-14 days. Please follow up with your PCP if your symptoms are not improving. Please go to the ER for any worsening symptoms. This includes but is not limited to fever you can not control with tylenol  or ibuprofen , you are not able to stay hydrated, you have shortness of breath or chest pain.  Thank you for choosing [facility] for your healthcare needs. I hope you feel better soon!      ED Prescriptions   None    PDMP not reviewed this encounter.   Loreda Myla SAUNDERS, NP 09/04/24 1850

## 2024-09-04 NOTE — Discharge Instructions (Signed)
 You tested negative for COVID and strep throat.  Please treat your symptoms with over the counter cough medication, tylenol or ibuprofen , humidifier, and rest. Viral illnesses can last 7-14 days. Please follow up with your PCP if your symptoms are not improving. Please go to the ER for any worsening symptoms. This includes but is not limited to fever you can not control with tylenol or ibuprofen , you are not able to stay hydrated, you have shortness of breath or chest pain.  Thank you for choosing [facility] for your healthcare needs. I hope you feel better soon!

## 2024-09-08 ENCOUNTER — Ambulatory Visit
Admission: RE | Admit: 2024-09-08 | Discharge: 2024-09-08 | Disposition: A | Discharge status: Home / Self Care | Payer: Worker's Compensation | Source: Ambulatory Visit | Attending: Family Medicine | Admitting: Family Medicine

## 2024-09-08 DIAGNOSIS — M25532 Pain in left wrist: Secondary | ICD-10-CM

## 2024-11-08 ENCOUNTER — Other Ambulatory Visit: Payer: Self-pay

## 2024-11-08 ENCOUNTER — Emergency Department (HOSPITAL_BASED_OUTPATIENT_CLINIC_OR_DEPARTMENT_OTHER)
Admission: EM | Admit: 2024-11-08 | Discharge: 2024-11-09 | Disposition: A | Discharge status: Home / Self Care | Payer: Self-pay | Attending: Emergency Medicine | Admitting: Emergency Medicine

## 2024-11-08 ENCOUNTER — Emergency Department (HOSPITAL_BASED_OUTPATIENT_CLINIC_OR_DEPARTMENT_OTHER): Payer: Self-pay | Admitting: Radiology

## 2024-11-08 DIAGNOSIS — Z79899 Other long term (current) drug therapy: Secondary | ICD-10-CM | POA: Insufficient documentation

## 2024-11-08 DIAGNOSIS — S92251A Displaced fracture of navicular [scaphoid] of right foot, initial encounter for closed fracture: Secondary | ICD-10-CM | POA: Insufficient documentation

## 2024-11-08 DIAGNOSIS — W108XXA Fall (on) (from) other stairs and steps, initial encounter: Secondary | ICD-10-CM | POA: Insufficient documentation

## 2024-11-08 MED ORDER — OXYCODONE-ACETAMINOPHEN 5-325 MG PO TABS
1.0000 | ORAL_TABLET | Freq: Once | ORAL | Status: AC
  Administered 2024-11-09: 1 via ORAL
  Filled 2024-11-08: qty 1

## 2024-11-08 MED ORDER — OXYCODONE-ACETAMINOPHEN 5-325 MG PO TABS: 1.0000 | ORAL_TABLET | Freq: Four times a day (QID) | ORAL | 0 refills | Status: AC | PRN

## 2024-11-08 NOTE — ED Triage Notes (Signed)
 Pt POV reporting R ankle/foot pain after falling down stairs, unable to bear weight.

## 2024-11-09 NOTE — ED Notes (Signed)
 Reviewed discharge instructions, medications, and home care with pt. Pt verbalized understanding and had no further questions. Pt exited ED without complications.

## 2024-11-09 NOTE — Discharge Instructions (Signed)
 You were seen today and found to have a fracture of your foot/ankle.  You need to be nonweightbearing.  Use crutches at all times.  Ice and elevate.  You may follow-up with your orthopedist at [facility].  If you have difficulty following up, orthopedist on call was provided as well.

## 2024-11-09 NOTE — ED Provider Notes (Signed)
 [place] [facility] AT [facility] Provider Note   CSN: [id number] Arrival date & time: 11/08/24  1952     Patient presents with: Wendy Little is a 23 y.o. female.   HPI     This is a 23 year old female who presents with right foot pain.  Patient was that she tripped and fell down the steps and turned her right foot.  She has pain with wiggling her toes.  She took some Tylenol  with minimal relief.  Prior history of left foot fracture.  Denies hitting her head or loss of consciousness.  Prior to Admission medications   Medication Sig Start Date End Date Taking? Authorizing Provider  oxyCODONE-acetaminophen  (PERCOCET/ROXICET) 5-325 MG tablet Take 1 tablet by mouth every 6 (six) hours as needed for severe pain (pain score 7-10). 11/08/24  Yes Demone Lyles, Charmaine FALCON, MD  cetirizine  (ZYRTEC  ALLERGY) 10 MG tablet Take 1 tablet (10 mg total) by mouth daily. 07/22/24   Christopher Savannah, PA-C  dexamethasone  0.5 MG/5ML elixir Use 5 mL swish and spit three times daily. 07/22/24   Christopher Savannah, PA-C  dicyclomine  (BENTYL ) 20 MG tablet Take 1 tablet (20 mg total) by mouth 2 (two) times daily. 05/17/24   Smoot, Lauraine LABOR, PA-C  estradiol (ESTRACE) 2 MG tablet Take 1 tablet by mouth daily. 03/14/23 04/13/23  [provider]  etonogestrel (NEXPLANON) 68 MG IMPL implant 68 mg by subdermal route once.    [provider]  omeprazole  (PRILOSEC) 20 MG capsule Take 1 capsule (20 mg total) by mouth daily. 05/17/24   Smoot, Lauraine LABOR, PA-C  ondansetron  (ZOFRAN -ODT) 4 MG disintegrating tablet Take 1 tablet (4 mg total) by mouth every 8 (eight) hours as needed for nausea or vomiting. 05/17/24   Smoot, Lauraine LABOR, PA-C  promethazine -dextromethorphan (PROMETHAZINE -DM) 6.25-15 MG/5ML syrup Take 5 mLs by mouth 3 (three) times daily as needed for cough. 03/28/24   Christopher Savannah, PA-C  pseudoephedrine  (SUDAFED) 60 MG tablet Take 1 tablet (60 mg total) by mouth every 8 (eight) hours as needed for  congestion. 07/22/24   Christopher Savannah, PA-C    Allergies: Patient has no known allergies.    Review of Systems  Constitutional:  Negative for fever.  Musculoskeletal:        Foot pain  Skin:  Positive for wound.  All other systems reviewed and are negative.   Updated Vital Signs BP 127/80   Pulse 95   Temp 97.8 F (36.6 C) (Temporal)   Resp 19   Ht 1.651 m (5' 5)   Wt 103.9 kg   SpO2 99%   BMI 38.11 kg/m   Physical Exam Vitals and nursing note reviewed.  Constitutional:      Appearance: She is well-developed. She is not ill-appearing.  HENT:     Head: Normocephalic and atraumatic.  Cardiovascular:     Rate and Rhythm: Normal rate and regular rhythm.  Pulmonary:     Effort: Pulmonary effort is normal. No respiratory distress.  Abdominal:     Palpations: Abdomen is soft.  Musculoskeletal:     Comments: Focused examination of the right foot with swelling and bruising noted over the anterior aspect of the dorsum of the foot, there is tenderness to palpation, 2+ DP pulse, neurovascularly intact distally  Skin:    General: Skin is warm and dry.  Neurological:     Mental Status: She is alert and oriented to person, place, and time.  Psychiatric:  Mood and Affect: Mood normal.     (all labs ordered are listed, but only abnormal results are displayed) Labs Reviewed - No data to display  EKG: None  Radiology: DG Ankle Complete Right Result Date: 11/08/2024 CLINICAL DATA:  Status post falling down stairs. EXAM: RIGHT ANKLE - COMPLETE 3+ VIEW COMPARISON:  None Available. FINDINGS: Very small, mildly displaced fracture fragments are seen along the dorsal aspect of the right navicular bone. There is no evidence of dislocation. There is no evidence of arthropathy or other focal bone abnormality. Moderate severity focal soft tissue swelling is seen involving the dorsal aspect of the proximal right foot, at the previously noted fracture site. IMPRESSION: Very small, mildly  displaced fracture fragments along the dorsal aspect of the right navicular bone. Electronically Signed   By: Suzen Dials M.D.   On: 11/08/2024 21:02   DG Foot Complete Right Result Date: 11/08/2024 CLINICAL DATA:  Status post falling down stairs. EXAM: RIGHT FOOT COMPLETE - 3+ VIEW COMPARISON:  None Available. FINDINGS: Very small mildly displaced fracture fragments are seen along the dorsal aspect of the right navicular bone. There is no evidence of dislocation. There is no evidence of arthropathy or other focal bone abnormality. Moderate severity focal soft tissue swelling is seen along the dorsal aspect of the proximal right foot. IMPRESSION: Acute fracture involving the dorsal aspect of the right navicular bone. Electronically Signed   By: Suzen Dials M.D.   On: 11/08/2024 21:00     Procedures   Medications Ordered in the ED  oxyCODONE-acetaminophen  (PERCOCET/ROXICET) 5-325 MG per tablet 1 tablet (has no administration in time range)                                    Medical Decision Making Amount and/or Complexity of Data Reviewed Radiology: ordered.  Risk Prescription drug management.   This patient presents to the ED for concern of foot pain, this involves an extensive number of treatment options, and is a complaint that carries with it a high risk of complications and morbidity.  I considered the following differential and admission for this acute, potentially life threatening condition.  The differential diagnosis includes fracture, sprain, dislocation, contusion  MDM:    This is a 23 year old female who presents with right foot pain after falling down steps.  She is nontoxic and vital signs are reassuring.  X-ray shows a fracture of the navicular bone.  She was placed in a short leg splint and given crutches.  Should be nonweightbearing.  She reports that she has an orthopedist at [facility] and will follow-up with them.  She was given a short course of Percocet.   Discussed ice and elevation.  (Labs, imaging, consults)  Labs: I Ordered, and personally interpreted labs.  The pertinent results include: None  Imaging Studies ordered: I ordered imaging studies including foot x-rays I independently visualized and interpreted imaging. I agree with the radiologist interpretation  Additional history obtained from chart review.  External records from outside source obtained and reviewed including prior evaluations  Cardiac Monitoring: The patient was not maintained on a cardiac monitor.  If on the cardiac monitor, I personally viewed and interpreted the cardiac monitored which showed an underlying rhythm of: N/A  Reevaluation: After the interventions noted above, I reevaluated the patient and found that they have :stayed the same  Social Determinants of Health:  lives independently  Disposition: Discharge  Co  morbidities that complicate the patient evaluation No past medical history on file.   Medicines Meds ordered this encounter  Medications   oxyCODONE-acetaminophen  (PERCOCET/ROXICET) 5-325 MG per tablet 1 tablet    Refill:  0   oxyCODONE-acetaminophen  (PERCOCET/ROXICET) 5-325 MG tablet    Sig: Take 1 tablet by mouth every 6 (six) hours as needed for severe pain (pain score 7-10).    Dispense:  10 tablet    Refill:  0    I have reviewed the patients home medicines and have made adjustments as needed  Problem List / ED Course: Problem List Items Addressed This Visit   None Visit Diagnoses       Closed displaced fracture of navicular bone of right foot, initial encounter    -  Primary                Final diagnoses:  Closed displaced fracture of navicular bone of right foot, initial encounter    ED Discharge Orders          Ordered    oxyCODONE-acetaminophen  (PERCOCET/ROXICET) 5-325 MG tablet  Every 6 hours PRN        11/08/24 2359               Bari Charmaine FALCON, MD 11/09/24 0002
